# Patient Record
Sex: Male | Born: 1995 | Race: Black or African American | Hispanic: No | Marital: Single | Smoking: Never smoker
Health system: Southern US, Community
[De-identification: ages and names within clinical notes are randomized; demographics above are authoritative.]

---
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
## 2017-01-16 ENCOUNTER — Emergency Department (INDEPENDENT_AMBULATORY_CARE_PROVIDER_SITE_OTHER): Payer: BLUE CROSS/BLUE SHIELD

## 2017-01-16 ENCOUNTER — Emergency Department (INDEPENDENT_AMBULATORY_CARE_PROVIDER_SITE_OTHER)
Admission: EM | Admit: 2017-01-16 | Discharge: 2017-01-16 | Disposition: A | Discharge status: Home / Self Care | Payer: BLUE CROSS/BLUE SHIELD | Source: Home / Self Care | Attending: Family Medicine | Admitting: Family Medicine

## 2017-01-16 ENCOUNTER — Encounter: Payer: Self-pay | Admitting: Emergency Medicine

## 2017-01-16 ENCOUNTER — Other Ambulatory Visit: Payer: Self-pay

## 2017-01-16 DIAGNOSIS — G252 Other specified forms of tremor: Secondary | ICD-10-CM

## 2017-01-16 DIAGNOSIS — M546 Pain in thoracic spine: Secondary | ICD-10-CM | POA: Diagnosis not present

## 2017-01-16 DIAGNOSIS — R259 Unspecified abnormal involuntary movements: Secondary | ICD-10-CM

## 2017-01-16 MED ORDER — CYCLOBENZAPRINE HCL 10 MG PO TABS: 10.0000 mg | ORAL_TABLET | Freq: Every day | ORAL | 0 refills | Status: AC

## 2017-01-16 MED ORDER — MELOXICAM 15 MG PO TABS: 15.0000 mg | ORAL_TABLET | Freq: Every day | ORAL | 0 refills | Status: AC

## 2017-01-16 NOTE — Discharge Instructions (Signed)
Apply ice pack for 20 to 30 minutes, 2 to 3 times daily  Continue until pain and swelling decrease.  If back pain does not improve in about two weeks, recommend follow-up with sports medicine physician.

## 2017-01-16 NOTE — ED Triage Notes (Signed)
Patient plays recreational sports and hit back in October; has since had pain in lower right back and subtle right hand/arm tremors. No OTC today.

## 2017-01-16 NOTE — ED Provider Notes (Signed)
[facility]    CSN: [id number] Arrival date & time: 01/16/17  0804     History   Chief Complaint Chief Complaint  Patient presents with  . Back Pain    lower right  . Tremors    right hand    HPI Daniel Lindsey is a 22 y.o. male.   Patient reports that he had contusion to his right posterior chest two months ago.  He was evaluated in ER where chest X-ray was negative.  He complains of persistent pain in his right upper back that sometimes affects his sleep.  No cough or pleuritic pain.  He occasional takes Ibuprofen 400mg  which is helpful. He also complains of a two month history of resting tremor in his right hand (he is left handed).  He denies loss of strength, paresthesias, or other neurologic symptoms.   The history is provided by the patient and a parent.    History reviewed. No pertinent past medical history.  There are no active problems to display for this patient.   History reviewed. No pertinent surgical history.     Home Medications    Prior to Admission medications   Medication Sig Start Date End Date Taking? Authorizing Provider  cyclobenzaprine (FLEXERIL) 10 MG tablet Take 1 tablet (10 mg total) by mouth at bedtime. 01/16/17   Lattie HawBeese, Adelfo Diebel A, MD  meloxicam (MOBIC) 15 MG tablet Take 1 tablet (15 mg total) by mouth daily. Take with food each evening. 01/16/17   Lattie HawBeese, Rashad Obeid A, MD    Family History History reviewed. No pertinent family history.  Social History Social History   Tobacco Use  . Smoking status: Never Smoker  . Smokeless tobacco: Never Used  Substance Use Topics  . Alcohol use: No  . Drug use: Not on file     Allergies   Patient has no known allergies.   Review of Systems Review of Systems  Constitutional: Negative for activity change, appetite change, chills, diaphoresis, fatigue and fever.  HENT: Negative.   Eyes: Negative.   Respiratory: Negative.   Cardiovascular: Negative.   Gastrointestinal: Negative.     Genitourinary: Negative.   Musculoskeletal: Positive for back pain.  Skin: Negative.   Neurological: Positive for tremors. Negative for dizziness, seizures, syncope, facial asymmetry, speech difficulty, weakness, light-headedness, numbness and headaches.     Physical Exam Triage Vital Signs ED Triage Vitals  Enc Vitals Group     BP 01/16/17 0827 115/69     Pulse Rate 01/16/17 0827 (!) 55     Resp 01/16/17 0827 16     Temp 01/16/17 0827 98.4 F (36.9 C)     Temp Source 01/16/17 0827 Oral     SpO2 01/16/17 0827 98 %     Weight 01/16/17 0828 145 lb (65.8 kg)     Height 01/16/17 0828 6\' 1"  (1.854 m)     Head Circumference --      Peak Flow --      Pain Score 01/16/17 0828 6     Pain Loc --      Pain Edu? --      Excl. in GC? --    No data found.  Updated Vital Signs BP 115/69 (BP Location: Right Arm)   Pulse (!) 55   Temp 98.4 F (36.9 C) (Oral)   Resp 16   Ht 6\' 1"  (1.854 m)   Wt 145 lb (65.8 kg)   SpO2 98%   BMI 19.13 kg/m   Visual Acuity  Right Eye Distance:   Left Eye Distance:   Bilateral Distance:    Right Eye Near:   Left Eye Near:    Bilateral Near:     Physical Exam  Constitutional: He is oriented to person, place, and time. He appears well-developed and well-nourished. No distress.  HENT:  Head: Normocephalic.  Right Ear: External ear normal.  Left Ear: External ear normal.  Nose: Nose normal.  Mouth/Throat: Oropharynx is clear and moist.  Eyes: Conjunctivae and EOM are normal. Pupils are equal, round, and reactive to light.  Neck: Normal range of motion.  Cardiovascular: Normal heart sounds.  Pulmonary/Chest: Breath sounds normal.  Abdominal: There is no tenderness.  Musculoskeletal: Normal range of motion.       Back:  Tenderness along midline spine posterior chest, extending to right thoracic paraspinous area as noted on diagram.   Lymphadenopathy:    He has no cervical adenopathy.  Neurological: He is alert and oriented to person, place,  and time. He has normal strength and normal reflexes. He displays tremor. No cranial nerve deficit or sensory deficit. He displays a negative Romberg sign.  Note resting tremor right hand.  Neurologic exam otherwise normal.  Skin: Skin is warm and dry. No rash noted.  Nursing note and vitals reviewed.    UC Treatments / Results  Labs (all labs ordered are listed, but only abnormal results are displayed) Labs Reviewed - No data to display  EKG  EKG Interpretation None       Radiology Dg Thoracic Spine 2 View  Result Date: 01/16/2017 CLINICAL DATA:  Mid to upper back pain for 2 months after injury. EXAM: THORACIC SPINE 2 VIEWS COMPARISON:  None. FINDINGS: Minimal dextrocurvature of the mid thoracic spine. Thoracic vertebral body heights are preserved, with no fracture, subluxation or suspicious focal osseous lesions. No significant degenerative changes. IMPRESSION: No fracture or subluxation. Electronically Signed   By: Delbert Phenix M.D.   On: 01/16/2017 09:23    Procedures Procedures (including critical care time)  Medications Ordered in UC Medications - No data to display   Initial Impression / Assessment and Plan / UC Course  I have reviewed the triage vital signs and the nursing notes.  Pertinent labs & imaging results that were available during my care of the patient were reviewed by me and considered in my medical decision making (see chart for details).    Begin Mobic 15mg  each evening, and Flexeril 10mg  each evening. Apply ice pack for 20 to 30 minutes, 2 to 3 times daily  Continue until pain and swelling decrease.  If back pain does not improve in about two weeks, recommend follow-up with sports medicine physician.  Will arrange neurologist evaluation for resting tremor of right hand.    Final Clinical Impressions(s) / UC Diagnoses   Final diagnoses:  Right-sided thoracic back pain, unspecified chronicity  Resting tremor    ED Discharge Orders        Ordered      meloxicam (MOBIC) 15 MG tablet  Daily     01/16/17 0940    cyclobenzaprine (FLEXERIL) 10 MG tablet  Daily at bedtime     01/16/17 0940          Lattie Haw, MD 01/16/17 1021

## 2017-01-17 ENCOUNTER — Telehealth: Payer: Self-pay

## 2017-01-17 NOTE — Telephone Encounter (Signed)
Doing better.  Notified that referral for [facility] was sent.  Will follow up as needed.

## 2018-04-12 IMAGING — DX DG THORACIC SPINE 2V
3 series · 3 of 3 positions shown · non-contrast
Comparison: None.

CLINICAL DATA: Mid to upper back pain for 2 months after injury.

EXAM:
THORACIC SPINE 2 VIEWS

[t-spine ap]
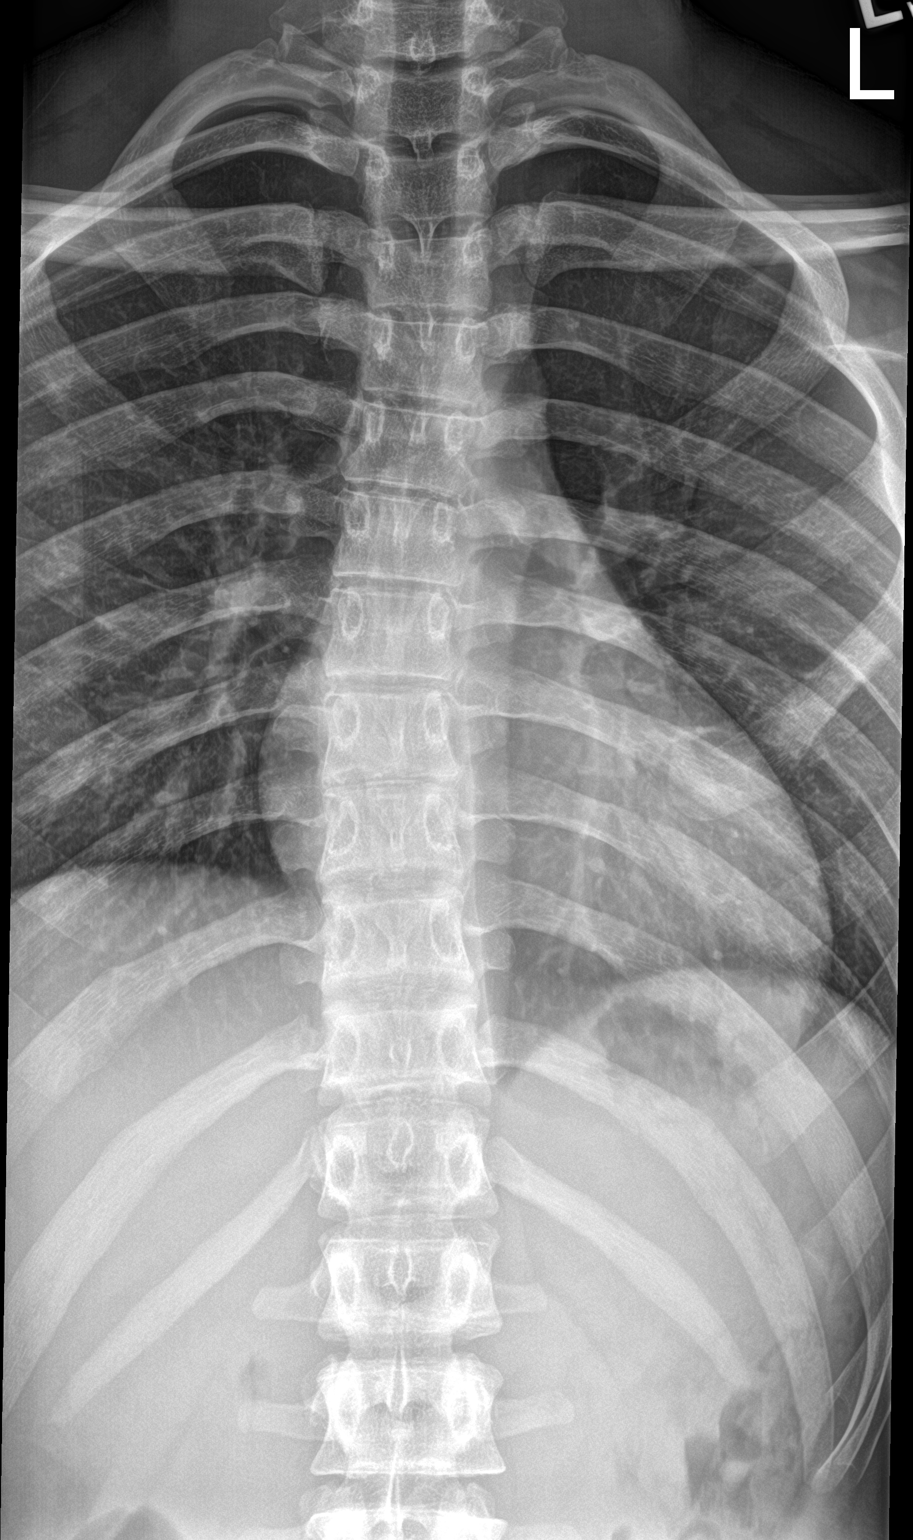

[t-spine lat]
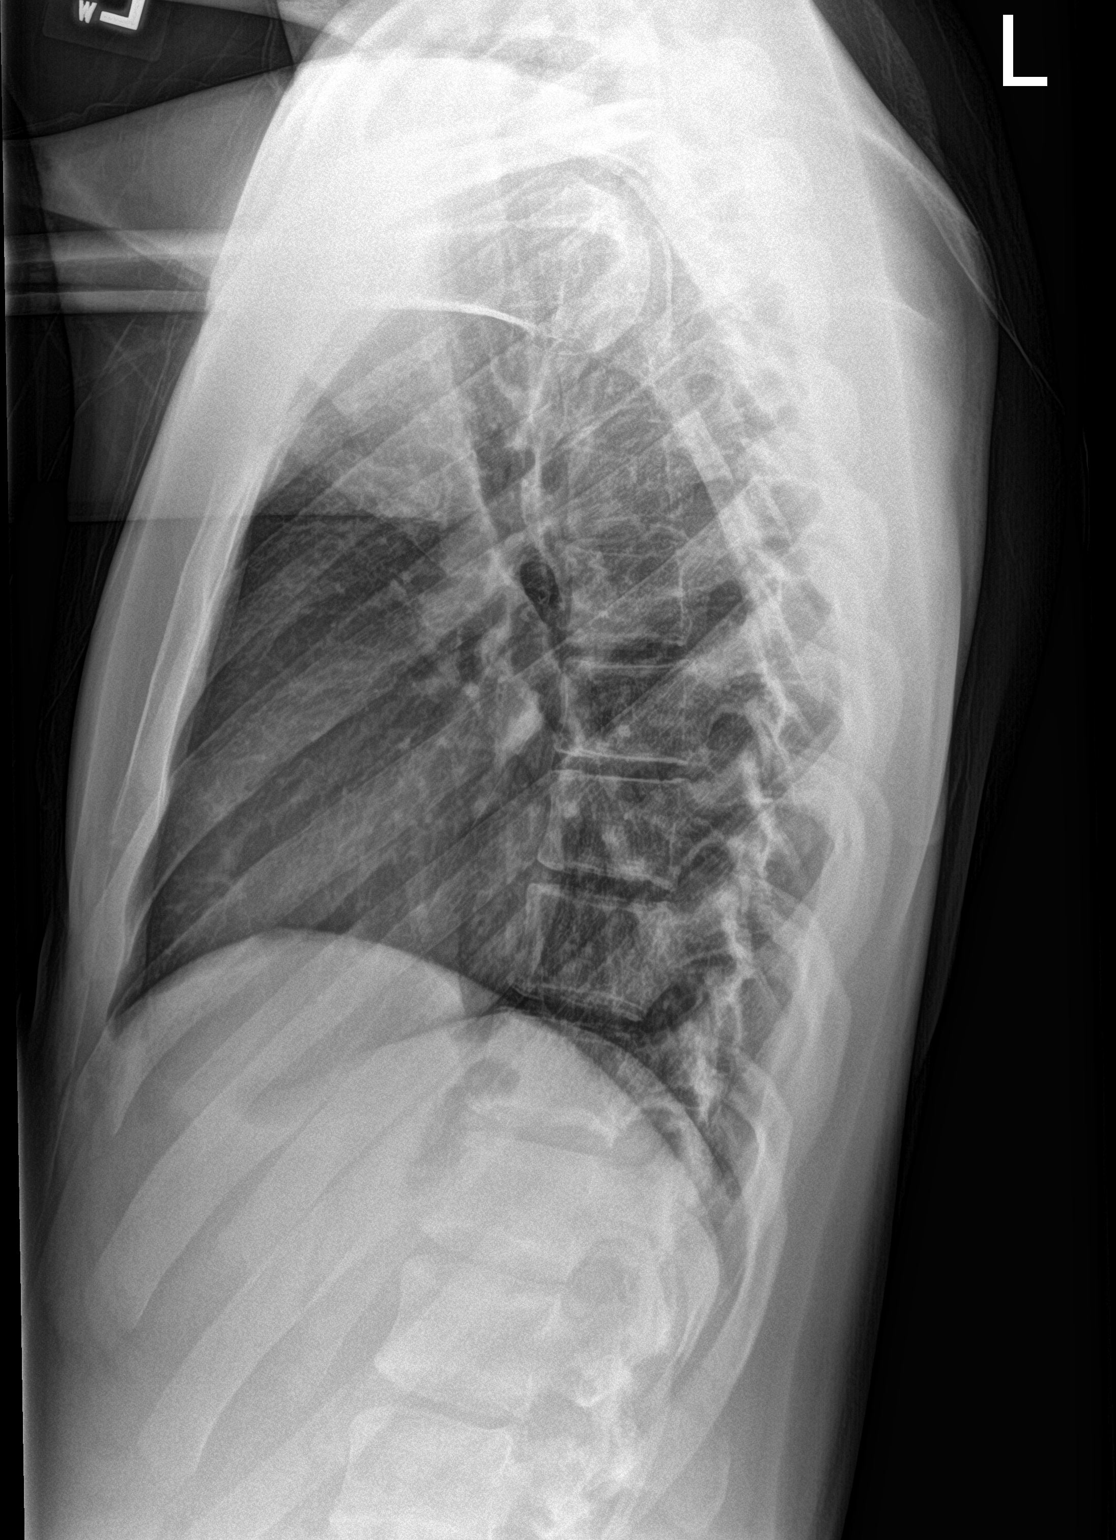

[t-spine swimmers]
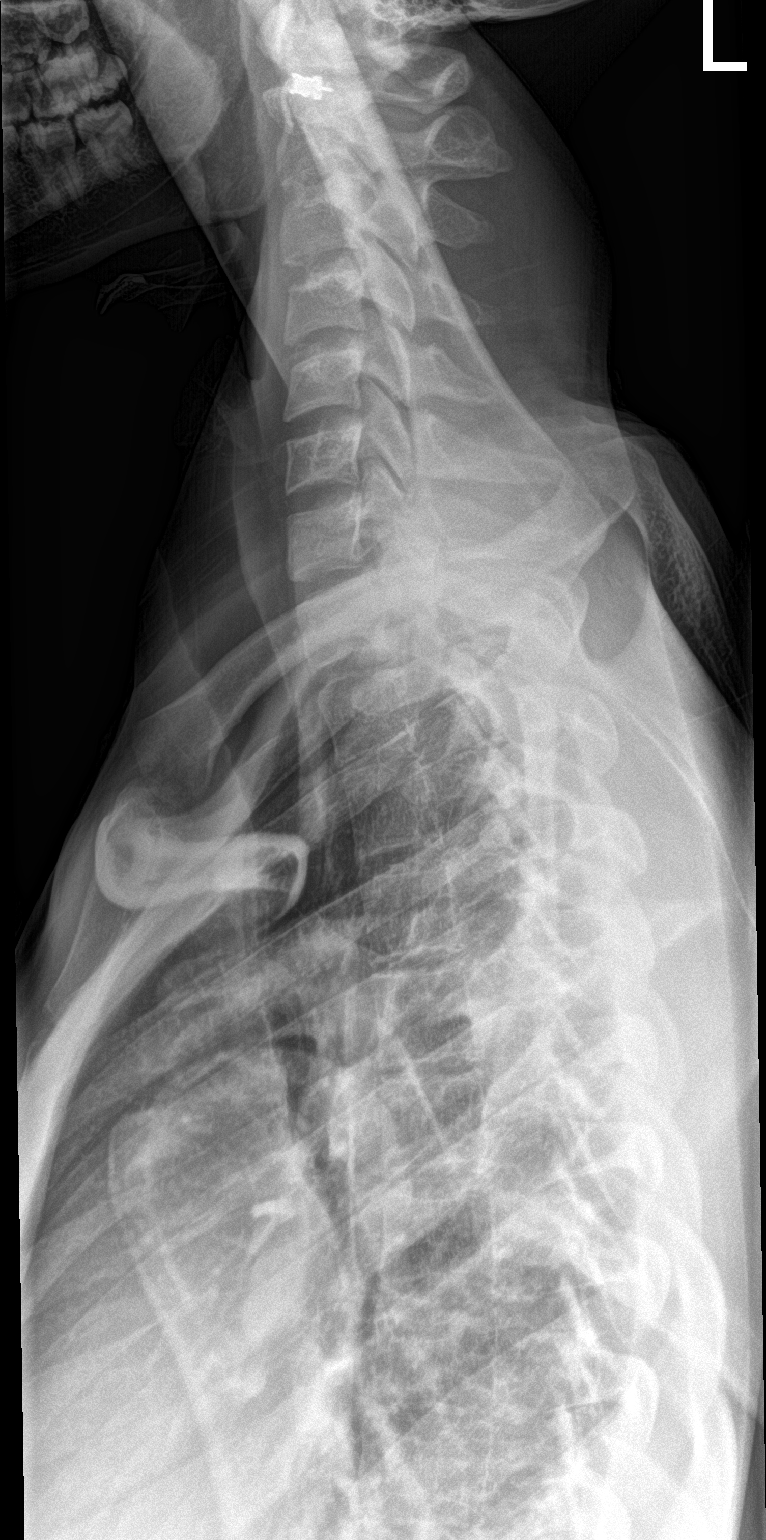

[3 of 3 positions shown; findings below may reference images not displayed]

FINDINGS: Minimal dextrocurvature of the mid thoracic spine. Thoracic
vertebral body heights are preserved, with no fracture, subluxation
or suspicious focal osseous lesions. No significant degenerative
changes.
IMPRESSION: No fracture or subluxation.

## 2023-05-18 ENCOUNTER — Ambulatory Visit
Admission: EM | Admit: 2023-05-18 | Discharge: 2023-05-18 | Disposition: A | Discharge status: Home / Self Care | Attending: Physician Assistant | Admitting: Physician Assistant

## 2023-05-18 ENCOUNTER — Other Ambulatory Visit: Payer: Self-pay

## 2023-05-18 DIAGNOSIS — S61412A Laceration without foreign body of left hand, initial encounter: Secondary | ICD-10-CM | POA: Diagnosis not present

## 2023-05-18 NOTE — ED Provider Notes (Signed)
 [facility]    CSN: [id number] Arrival date & time: 05/18/23  1827      History   Chief Complaint Chief Complaint  Patient presents with   Laceration    HPI Daniel Lindsey is a 28 y.o. male.   HPI  Patient presents today with concerns of small laceration to the left index finger.  He reports that he is left-handed and the area is slightly sore and itching.  He reports that the injury happened at about 8 PM last night and he is kept the area clean and dry and covered with a bandage.  Patient reports that the blender was new out of the box and appeared clean.  He thinks that his last tetanus shot was either 2018 or 2019 but is not completely sure when.  History reviewed. No pertinent past medical history.  There are no active problems to display for this patient.   History reviewed. No pertinent surgical history.     Home Medications    Prior to Admission medications   Medication Sig Start Date End Date Taking? Authorizing Provider  cyclobenzaprine  (FLEXERIL ) 10 MG tablet Take 1 tablet (10 mg total) by mouth at bedtime. 01/16/17   Leon Rajas, MD  meloxicam  (MOBIC ) 15 MG tablet Take 1 tablet (15 mg total) by mouth daily. Take with food each evening. 01/16/17   Leon Rajas, MD    Family History History reviewed. No pertinent family history.  Social History Social History   Tobacco Use   Smoking status: Never   Smokeless tobacco: Never  Vaping Use   Vaping status: Never Used  Substance Use Topics   Alcohol use: Yes   Drug use: Yes    Types: Marijuana     Allergies   Other   Review of Systems Review of Systems  Skin:  Positive for wound.     Physical Exam Triage Vital Signs ED Triage Vitals  Encounter Vitals Group     BP 05/18/23 1849 129/79     Systolic BP Percentile --      Diastolic BP Percentile --      Pulse Rate 05/18/23 1849 (!) 57     Resp 05/18/23 1849 18     Temp 05/18/23 1849 98.1 F (36.7 C)     Temp Source  05/18/23 1849 Oral     SpO2 05/18/23 1849 97 %     Weight 05/18/23 1847 165 lb (74.8 kg)     Height 05/18/23 1847 6\' 1"  (1.854 m)     Head Circumference --      Peak Flow --      Pain Score 05/18/23 1847 4     Pain Loc --      Pain Education --      Exclude from Growth Chart --    No data found.  Updated Vital Signs BP 129/79 (BP Location: Right Arm)   Pulse (!) 57   Temp 98.1 F (36.7 C) (Oral)   Resp 18   Ht 6\' 1"  (1.854 m)   Wt 165 lb (74.8 kg)   SpO2 97%   BMI 21.77 kg/m   Visual Acuity Right Eye Distance:   Left Eye Distance:   Bilateral Distance:    Right Eye Near:   Left Eye Near:    Bilateral Near:     Physical Exam Vitals reviewed.  Constitutional:      General: He is awake.     Appearance: Normal appearance. He is well-developed and  well-groomed.  HENT:     Head: Normocephalic and atraumatic.  Eyes:     Extraocular Movements: Extraocular movements intact.     Conjunctiva/sclera: Conjunctivae normal.  Pulmonary:     Effort: Pulmonary effort is normal.  Musculoskeletal:     Left hand: Laceration and tenderness present. No swelling. Normal range of motion. Normal strength. Normal sensation. Normal capillary refill. Normal pulse.       Hands:     Cervical back: Normal range of motion.     Comments: Patient has approximately 1 cm long superficial laceration to the left index finger. Range of motion is intact with regards to flexion and extension of the finger.  Capillary refill at the distal aspect of the index finger is less than 2 seconds.  There is no notable swelling, erythema, drainage present the wound is no longer bleeding and there appears to be mild granulation formulation along borders.  Neurological:     General: No focal deficit present.     Mental Status: He is alert and oriented to person, place, and time.     GCS: GCS eye subscore is 4. GCS verbal subscore is 5. GCS motor subscore is 6.  Psychiatric:        Attention and Perception:  Attention normal.        Mood and Affect: Mood normal.        Speech: Speech normal.        Behavior: Behavior normal. Behavior is cooperative.      UC Treatments / Results  Labs (all labs ordered are listed, but only abnormal results are displayed) Labs Reviewed - No data to display  EKG   Radiology No results found.  Procedures Procedures (including critical care time)  Medications Ordered in UC Medications - No data to display  Initial Impression / Assessment and Plan / UC Course  I have reviewed the triage vital signs and the nursing notes.  Pertinent labs & imaging results that were available during my care of the patient were reviewed by me and considered in my medical decision making (see chart for details).      Final Clinical Impressions(s) / UC Diagnoses   Final diagnoses:  Laceration of left hand without foreign body, initial encounter   Patient presents today with concerns for laceration of the left index finger that occurred at approximately 8 PM last night.  Physical exam reveals superficial, approximately 1 cm long laceration to the left index finger.  Range of motion with regards to finger and hand appear intact and distal capillary refill is less than 2 seconds.  Wound is not bleeding and there is no signs of drainage, erythema, swelling at this time.  Reviewed with patient that since the wound occurred more than 18 hours ago it is not recommended to closed with sutures or further intervention as this could cause issues with bacterial infection.  Patient voiced agreement understanding with recommendations.  Wound was cleansed and rebandaged here in clinic.  Patient reports that most recent tetanus booster was likely in 2018/2019 but he is not completely sure.  Attempted to do and see immunization/vaccine registry query but results have not come back as of the time of discharge.  Reviewed with patient that since wound was caused by a clean blade and most recent  tetanus booster seems to have been within 10 years he should not need a new booster.  Patient declined booster tonight given these recommendations.  Recommend keeping the wound clean with warm water and  gentle cleanser and then covered with a bandage to prevent contamination.  Reviewed that he can use Aquaphor or Vaseline to help keep area moist and promote appropriate healing.  ED and return precautions reviewed and provided in after visit summary.  Follow-up as needed.    Discharge Instructions      You were seen today for concerns of a cut to your left index finger. The wound does not appear contaminated and since it has been more than 18 hours since you sustained the injury it is not recommend that we close it with sutures or Steri-Strips as this could try bacteria within the wound and cause further complications. At this time I recommend keeping the area clean with warm water and a gentle soap and then patting to dry.  You can apply a small amount of Aquaphor or Vaseline to the area before applying a bandage to help prevent contamination.  The Aquaphor or Vaseline helps to keep the area moist and cut down on itching as the skin starts to repair itself. If at any point you start to develop difficulty bending your finger, swelling, redness, drainage that looks like pus, increased warmth to the area you can return here to urgent care or go to the emergency room for further evaluation and management.    ED Prescriptions   None    PDMP not reviewed this encounter.   Jerona Mooring, PA-C 05/18/23 1926

## 2023-05-18 NOTE — Discharge Instructions (Signed)
 You were seen today for concerns of a cut to your left index finger. The wound does not appear contaminated and since it has been more than 18 hours since you sustained the injury it is not recommend that we close it with sutures or Steri-Strips as this could try bacteria within the wound and cause further complications. At this time I recommend keeping the area clean with warm water and a gentle soap and then patting to dry.  You can apply a small amount of Aquaphor or Vaseline to the area before applying a bandage to help prevent contamination.  The Aquaphor or Vaseline helps to keep the area moist and cut down on itching as the skin starts to repair itself. If at any point you start to develop difficulty bending your finger, swelling, redness, drainage that looks like pus, increased warmth to the area you can return here to urgent care or go to the emergency room for further evaluation and management.

## 2023-05-18 NOTE — ED Triage Notes (Addendum)
 Pt presents with complaints of laceration to left knuckle after opening a ninja blender last night at approximately 8 PM. Pt currently rates his overall pain a 4/10, describes as sore. Neosporin and Band-aid applied PTA. No bleeding noted on assessment. Last tetanus shot in 2019 per pt.

## 2023-05-18 NOTE — ED Notes (Signed)
 Wound cleaned with sterile water. Guaze applied, wrapped with brown coban.

## 2023-07-31 ENCOUNTER — Ambulatory Visit: Payer: Self-pay
